# Patient Record
Sex: Female | Born: 1960 | Race: White | Hispanic: No | Marital: Married | Smoking: Former smoker
Health system: Southern US, Community
[De-identification: ages and names within clinical notes are randomized; demographics above are authoritative.]

## PROBLEM LIST (undated history)

## (undated) DIAGNOSIS — R011 Cardiac murmur, unspecified: Secondary | ICD-10-CM

## (undated) DIAGNOSIS — I10 Essential (primary) hypertension: Secondary | ICD-10-CM

## (undated) DIAGNOSIS — E785 Hyperlipidemia, unspecified: Secondary | ICD-10-CM

## (undated) DIAGNOSIS — N879 Dysplasia of cervix uteri, unspecified: Secondary | ICD-10-CM

## (undated) HISTORY — DX: Essential (primary) hypertension: I10

## (undated) HISTORY — DX: Cardiac murmur, unspecified: R01.1

## (undated) HISTORY — DX: Hyperlipidemia, unspecified: E78.5

## (undated) HISTORY — DX: Dysplasia of cervix uteri, unspecified: N87.9

---

## 2000-09-09 ENCOUNTER — Other Ambulatory Visit: Admission: RE | Admit: 2000-09-09 | Discharge: 2000-09-09 | Payer: Self-pay | Admitting: Family Medicine

## 2000-09-11 ENCOUNTER — Encounter: Payer: Self-pay | Admitting: Family Medicine

## 2000-09-11 ENCOUNTER — Ambulatory Visit (HOSPITAL_COMMUNITY): Admission: RE | Admit: 2000-09-11 | Discharge: 2000-09-11 | Payer: Self-pay | Admitting: Family Medicine

## 2001-10-15 ENCOUNTER — Other Ambulatory Visit: Admission: RE | Admit: 2001-10-15 | Discharge: 2001-10-15 | Payer: Self-pay | Admitting: Family Medicine

## 2003-11-15 ENCOUNTER — Other Ambulatory Visit: Admission: RE | Admit: 2003-11-15 | Discharge: 2003-11-15 | Payer: Self-pay | Admitting: Family Medicine

## 2004-11-15 ENCOUNTER — Other Ambulatory Visit: Admission: RE | Admit: 2004-11-15 | Discharge: 2004-11-15 | Payer: Self-pay | Admitting: Family Medicine

## 2005-12-25 ENCOUNTER — Other Ambulatory Visit: Admission: RE | Admit: 2005-12-25 | Discharge: 2005-12-25 | Payer: Self-pay | Admitting: Family Medicine

## 2007-04-16 ENCOUNTER — Other Ambulatory Visit: Admission: RE | Admit: 2007-04-16 | Discharge: 2007-04-16 | Payer: Self-pay | Admitting: Family Medicine

## 2008-04-25 ENCOUNTER — Other Ambulatory Visit: Admission: RE | Admit: 2008-04-25 | Discharge: 2008-04-25 | Payer: Self-pay | Admitting: Family Medicine

## 2009-05-01 ENCOUNTER — Encounter: Admission: RE | Admit: 2009-05-01 | Discharge: 2009-05-01 | Payer: Self-pay | Admitting: Family Medicine

## 2009-05-24 ENCOUNTER — Other Ambulatory Visit: Admission: RE | Admit: 2009-05-24 | Discharge: 2009-05-24 | Payer: Self-pay | Admitting: Family Medicine

## 2010-06-11 ENCOUNTER — Other Ambulatory Visit: Admission: RE | Admit: 2010-06-11 | Discharge: 2010-06-11 | Payer: Self-pay | Admitting: Family Medicine

## 2017-12-12 ENCOUNTER — Other Ambulatory Visit: Payer: Self-pay | Admitting: Family Medicine

## 2017-12-12 ENCOUNTER — Other Ambulatory Visit: Payer: Self-pay

## 2017-12-12 ENCOUNTER — Ambulatory Visit
Admission: RE | Admit: 2017-12-12 | Discharge: 2017-12-12 | Disposition: A | Discharge status: Home / Self Care | Payer: 59 | Source: Ambulatory Visit | Attending: Family Medicine | Admitting: Family Medicine

## 2017-12-12 DIAGNOSIS — J4531 Mild persistent asthma with (acute) exacerbation: Secondary | ICD-10-CM

## 2018-01-27 ENCOUNTER — Other Ambulatory Visit: Payer: Self-pay | Admitting: Family Medicine

## 2018-01-27 ENCOUNTER — Encounter: Payer: Self-pay | Admitting: Genetics

## 2018-01-27 DIAGNOSIS — D72828 Other elevated white blood cell count: Secondary | ICD-10-CM

## 2018-01-27 DIAGNOSIS — J40 Bronchitis, not specified as acute or chronic: Secondary | ICD-10-CM

## 2018-02-02 ENCOUNTER — Encounter: Payer: Self-pay | Admitting: Internal Medicine

## 2018-02-16 ENCOUNTER — Encounter (HOSPITAL_COMMUNITY): Payer: Self-pay

## 2018-02-16 ENCOUNTER — Encounter: Payer: Self-pay | Admitting: Internal Medicine

## 2018-02-16 ENCOUNTER — Ambulatory Visit (HOSPITAL_COMMUNITY)
Admission: RE | Admit: 2018-02-16 | Discharge: 2018-02-16 | Disposition: A | Discharge status: Home / Self Care | Payer: Managed Care, Other (non HMO) | Source: Ambulatory Visit | Attending: Internal Medicine | Admitting: Internal Medicine

## 2018-02-16 ENCOUNTER — Inpatient Hospital Stay: Payer: Managed Care, Other (non HMO)

## 2018-02-16 ENCOUNTER — Inpatient Hospital Stay: Payer: Managed Care, Other (non HMO) | Attending: Internal Medicine | Admitting: Internal Medicine

## 2018-02-16 VITALS — BP 139/90 | HR 86 | Temp 98.1°F | Resp 20 | Ht 67.5 in | Wt 194.6 lb

## 2018-02-16 DIAGNOSIS — R05 Cough: Secondary | ICD-10-CM | POA: Diagnosis not present

## 2018-02-16 DIAGNOSIS — R062 Wheezing: Secondary | ICD-10-CM

## 2018-02-16 DIAGNOSIS — F17210 Nicotine dependence, cigarettes, uncomplicated: Secondary | ICD-10-CM | POA: Diagnosis not present

## 2018-02-16 DIAGNOSIS — Z801 Family history of malignant neoplasm of trachea, bronchus and lung: Secondary | ICD-10-CM | POA: Diagnosis not present

## 2018-02-16 DIAGNOSIS — S2243XA Multiple fractures of ribs, bilateral, initial encounter for closed fracture: Secondary | ICD-10-CM | POA: Insufficient documentation

## 2018-02-16 DIAGNOSIS — R911 Solitary pulmonary nodule: Secondary | ICD-10-CM | POA: Diagnosis not present

## 2018-02-16 DIAGNOSIS — E785 Hyperlipidemia, unspecified: Secondary | ICD-10-CM | POA: Insufficient documentation

## 2018-02-16 DIAGNOSIS — R0602 Shortness of breath: Secondary | ICD-10-CM | POA: Insufficient documentation

## 2018-02-16 DIAGNOSIS — D72829 Elevated white blood cell count, unspecified: Secondary | ICD-10-CM

## 2018-02-16 DIAGNOSIS — I2510 Atherosclerotic heart disease of native coronary artery without angina pectoris: Secondary | ICD-10-CM | POA: Insufficient documentation

## 2018-02-16 DIAGNOSIS — K589 Irritable bowel syndrome without diarrhea: Secondary | ICD-10-CM | POA: Diagnosis not present

## 2018-02-16 DIAGNOSIS — J438 Other emphysema: Secondary | ICD-10-CM | POA: Diagnosis not present

## 2018-02-16 DIAGNOSIS — I10 Essential (primary) hypertension: Secondary | ICD-10-CM | POA: Insufficient documentation

## 2018-02-16 DIAGNOSIS — R059 Cough, unspecified: Secondary | ICD-10-CM | POA: Insufficient documentation

## 2018-02-16 DIAGNOSIS — J432 Centrilobular emphysema: Secondary | ICD-10-CM | POA: Insufficient documentation

## 2018-02-16 LAB — CBC WITH DIFFERENTIAL (CANCER CENTER ONLY)
Basophils Absolute: 0 10*3/uL (ref 0.0–0.1)
Basophils Relative: 0 %
Eosinophils Absolute: 0.1 10*3/uL (ref 0.0–0.5)
Eosinophils Relative: 1 %
HEMATOCRIT: 38.3 % (ref 34.8–46.6)
Hemoglobin: 13.4 g/dL (ref 11.6–15.9)
Lymphocytes Relative: 27 %
Lymphs Abs: 2.8 10*3/uL (ref 0.9–3.3)
MCH: 33 pg (ref 25.1–34.0)
MCHC: 35 g/dL (ref 31.5–36.0)
MCV: 94.3 fL (ref 79.5–101.0)
MONOS PCT: 12 %
Monocytes Absolute: 1.2 10*3/uL — ABNORMAL HIGH (ref 0.1–0.9)
NEUTROS ABS: 6 10*3/uL (ref 1.5–6.5)
Neutrophils Relative %: 60 %
Platelet Count: 339 10*3/uL (ref 145–400)
RBC: 4.06 MIL/uL (ref 3.70–5.45)
RDW: 13.1 % (ref 11.2–14.5)
WBC Count: 10.1 10*3/uL (ref 3.9–10.3)

## 2018-02-16 LAB — CMP (CANCER CENTER ONLY)
ALBUMIN: 4.1 g/dL (ref 3.5–5.0)
ALT: 76 U/L — ABNORMAL HIGH (ref 0–44)
AST: 43 U/L — AB (ref 15–41)
Alkaline Phosphatase: 63 U/L (ref 38–126)
Anion gap: 8 (ref 5–15)
BUN: 5 mg/dL — AB (ref 6–20)
CO2: 28 mmol/L (ref 22–32)
Calcium: 9.5 mg/dL (ref 8.9–10.3)
Chloride: 91 mmol/L — ABNORMAL LOW (ref 98–111)
Creatinine: 0.77 mg/dL (ref 0.44–1.00)
GFR, Est AFR Am: >60 mL/min (ref >60)
GFR, Estimated: >60 mL/min (ref >60)
Glucose, Bld: 104 mg/dL — ABNORMAL HIGH (ref 70–99)
Potassium: 3.4 mmol/L — ABNORMAL LOW (ref 3.5–5.1)
Sodium: 127 mmol/L — ABNORMAL LOW (ref 135–145)
Total Bilirubin: 0.5 mg/dL (ref 0.3–1.2)
Total Protein: 6.8 g/dL (ref 6.5–8.1)

## 2018-02-16 LAB — LACTATE DEHYDROGENASE: LDH: 226 U/L — ABNORMAL HIGH (ref 98–192)

## 2018-02-16 MED ORDER — IOHEXOL 300 MG/ML  SOLN
75.0000 mL | Freq: Once | INTRAMUSCULAR | Status: AC | PRN
  Administered 2018-02-16: 75 mL via INTRAVENOUS

## 2018-02-16 NOTE — Progress Notes (Signed)
[facility] [phone]   [phone]  CONSULT NOTE  REFERRING PHYSICIAN: Dr. Merri Brunette   REASON FOR CONSULTATION:  57 years old white female with persistent leukocytosis.  HPI Melika M Cowman is a 57 y.o. female with past medical history significant for hypertension, dyslipidemia, cervical dysplasia, irritable bowel disease.  The patient also has a long history of smoking.  She has been complaining of cough and shortness of breath since January 2019.  She was treated with several courses of antibiotics with no improvement in her condition.  She was also treated recently with steroids.  The patient continues to have significant wheezing and shortness of breath and severe cough.  She was seen by her primary care physician and was noticed on blood work to have elevated white blood count.  She was referred to me today for evaluation of her leukocytosis.  She was supposed to have CT scan of the chest but this was placed on hold until she sees me for the leukocytosis.  She also complains of indigestion and weight loss.  She also has nausea and diarrhea.  She denied having any headache or visual changes.  She has no current fever or chills. Family history significant for mother with coronary artery disease and diabetes mellitus, father had lung cancer. The patient is married and has no children.  She worked at a receiving office.  She has a history for smoking 1 pack/Schamp for around 4 years and she continues to smoke.  She denied having any history of alcohol or drug abuse.  HPI  Past Medical History:  Diagnosis Date  . Cervical dysplasia   . Dyslipidemia   . Hypertension      Family History  Problem Relation Age of Onset  . Diabetes Mother   . Coronary artery disease Mother   . Cancer Father   . Lung cancer Father     Social History Social History   Tobacco Use  . Smoking status: Current Every Decarli Smoker    Packs/Brundage: 1.00    Years: 40.00   Pack years: 40.00    Types: Cigarettes  . Smokeless tobacco: Never Used  Substance Use Topics  . Alcohol use: Not Currently  . Drug use: Not Currently    Not on File  No current outpatient medications on file.   No current facility-administered medications for this visit.     Review of Systems  Constitutional: positive for fatigue and weight loss Eyes: negative Ears, nose, mouth, throat, and face: negative Respiratory: positive for cough, dyspnea on exertion and wheezing Cardiovascular: negative Gastrointestinal: negative Genitourinary:negative Integument/breast: negative Hematologic/lymphatic: negative Musculoskeletal:negative Neurological: negative Behavioral/Psych: negative Endocrine: negative Allergic/Immunologic: negative  Physical Exam  AVW:UJWJX, healthy, no distress, well nourished, well developed and anxious SKIN: skin color, texture, turgor are normal, no rashes or significant lesions HEAD: Normocephalic, No masses, lesions, tenderness or abnormalities EYES: normal, PERRLA, Conjunctiva are pink and non-injected EARS: External ears normal, Canals clear OROPHARYNX:no exudate, no erythema and lips, buccal mucosa, and tongue normal  NECK: supple, no adenopathy, no JVD LYMPH:  no palpable lymphadenopathy, no hepatosplenomegaly BREAST:not examined LUNGS: prolonged expiratory phase, expiratory wheezes bilaterally HEART: regular rate & rhythm, no murmurs and no gallops ABDOMEN:abdomen soft, non-tender, normal bowel sounds and no masses or organomegaly BACK: Back symmetric, no curvature., No CVA tenderness EXTREMITIES:no joint deformities, effusion, or inflammation, no edema  NEURO: alert & oriented x 3 with fluent speech, no focal motor/sensory deficits  PERFORMANCE STATUS: ECOG 1  LABORATORY DATA: Lab Results  Component Value Date   WBC 10.1 02/16/2018   HGB 13.4 02/16/2018   HCT 38.3 02/16/2018   MCV 94.3 02/16/2018   PLT 339 02/16/2018      Chemistry       Component Value Date/Time   NA 127 (L) 02/16/2018 1147   K 3.4 (L) 02/16/2018 1147   CL 91 (L) 02/16/2018 1147   CO2 28 02/16/2018 1147   BUN 5 (L) 02/16/2018 1147   CREATININE 0.77 02/16/2018 1147      Component Value Date/Time   CALCIUM 9.5 02/16/2018 1147   ALKPHOS 63 02/16/2018 1147   AST 43 (H) 02/16/2018 1147   ALT 76 (H) 02/16/2018 1147   BILITOT 0.5 02/16/2018 1147       RADIOGRAPHIC STUDIES: No results found.  ASSESSMENT: This is a very pleasant 57 years old white female who presented for evaluation of persistent leukocytosis.  This was likely reactive in nature secondary to her frequent infection with bronchitis as well as treatment recently with steroids.  Repeat CBC today showed normal white blood count. The patient has a major problem with her persistent cough shortness of breath and wheezing bilaterally.  I am concerned that the patient could have underlying abnormality in her lung.   PLAN: I had a lengthy discussion with the patient today about her condition.  I assured her regarding the leukocytosis which completely resolved at this point. I recommended for the patient to have CT scan of the chest performed as soon as possible for evaluation of her shortness of breath, cough and wheezing especially with her long history for smoking and family history of lung cancer. If her scan shows no abnormality, I would recommend for the patient to get a referral from her primary care physician to a pulmonologist for addressing her breathing problem. If this scan showed any concerning abnormality for malignancy, I will arrange for the patient a follow-up appointment with me for further evaluation and recommendation regarding her condition.  The patient voices understanding of current disease status and treatment options and is in agreement with the current care plan.  All questions were answered. The patient knows to call the clinic with any problems, questions or concerns.  We can certainly see the patient much sooner if necessary.  Thank you so much for allowing me to participate in the care of Rozalia M Dolinsky. I will continue to follow up the patient with you and assist in her care.  I spent 40 minutes counseling the patient face to face. The total time spent in the appointment was 60 minutes.  Disclaimer: This note was dictated with voice recognition software. Similar sounding words can inadvertently be transcribed and may not be corrected upon review.   Lajuana MatteMohamed K Tamarah Bhullar February 16, 2018, 11:29 AM

## 2018-02-17 ENCOUNTER — Telehealth: Payer: Self-pay | Admitting: Medical Oncology

## 2018-02-17 NOTE — Telephone Encounter (Signed)
Pt notified that Arbutus PedMohamed will f/u with PCP and recommend referral to pulmonologist for f/u CT scan and emphysema.

## 2018-02-26 ENCOUNTER — Other Ambulatory Visit: Payer: Self-pay | Admitting: Family Medicine

## 2018-02-26 DIAGNOSIS — E041 Nontoxic single thyroid nodule: Secondary | ICD-10-CM

## 2018-03-04 ENCOUNTER — Encounter: Payer: Self-pay | Admitting: Pulmonary Disease

## 2018-03-04 ENCOUNTER — Ambulatory Visit (INDEPENDENT_AMBULATORY_CARE_PROVIDER_SITE_OTHER): Payer: Managed Care, Other (non HMO) | Admitting: Pulmonary Disease

## 2018-03-04 VITALS — BP 120/74 | HR 80 | Ht 67.5 in | Wt 192.0 lb

## 2018-03-04 DIAGNOSIS — J432 Centrilobular emphysema: Secondary | ICD-10-CM | POA: Diagnosis not present

## 2018-03-04 DIAGNOSIS — R911 Solitary pulmonary nodule: Secondary | ICD-10-CM | POA: Diagnosis not present

## 2018-03-04 DIAGNOSIS — Z720 Tobacco use: Secondary | ICD-10-CM

## 2018-03-04 MED ORDER — BUDESONIDE-FORMOTEROL FUMARATE 160-4.5 MCG/ACT IN AERO: 2.0000 | INHALATION_SPRAY | Freq: Two times a day (BID) | RESPIRATORY_TRACT | 0 refills | Status: DC

## 2018-03-04 NOTE — Assessment & Plan Note (Signed)
We will start her on steroid/LABA combination such as Symbicort 2 puffs twice daily Albuterol can be used for rescue on an as-needed basis  She will call us for prescription if this works

## 2018-03-04 NOTE — Assessment & Plan Note (Signed)
Clearly smoking cessation is paramount and this is most important intervention here. She is trying already with Chantix and has been able to cut down to half pack per Kon.  I encouraged her to set a quit date and okay to add a nicotine patch

## 2018-03-04 NOTE — Patient Instructions (Addendum)
Tiny left lower lung nodule will need follow-up CT chest without contrast in July 2020  Symbicort 162 puffs twice daily, rinse mouth after use. Call us for prescription if this works-  Continue to use albuterol on an as-needed basis for rescue.  Smoking cessation is the most important intervention. Congratulations on cutting down. Okay to use nicotine patch as needed

## 2018-03-04 NOTE — Assessment & Plan Note (Signed)
left lower lung nodule will need 1 y follow-up CT chest without contrast in July 2020 due to risk factor smoking

## 2018-03-04 NOTE — Progress Notes (Signed)
Subjective:    Patient ID: Darlene Patrick, female    DOB: 07/08/1961, 57 y.o.   MRN: [id number]  HPI  Chief Complaint  Patient presents with  . Pulm Consult    Referred by Dr. Carol Ada for  emphysema. Per patient, this was discovered after a recent CT scan. Also found out she has 2 broken ribs from coughing so hard. High humidity increases her SOB.     57 year old smoker presents for evaluation of emphysema. She smoked about a pack per Ammons starting as a teenager, more than 40 pack years and has recently cut down to about half pack per Noda using Chantix for the last 3 weeks with the idea of quitting eventually.  She had been able to quit a few years ago for about a year until she met her husband who also smokes. She reports dyspnea on exertion and the feeling of tiredness on routine activities throughout the [place] for the past 6 months.  She reports repeated lung infections since February 2019 for which she required antibiotics.  She reports occasional hoarseness.  She was given an albuterol MDI which she uses with minimal relief and this does not last even a couple of hours. She was noted to have persistent leukocytosis and she saw Dr. Julien Nordmann, WBC count was noted to have dropped to 10.1 and leukocytosis was felt to be reactive.  Due to her long history of smoking he ordered chest CT scan which we reviewed-this showed mild centrilobular apical emphysema,There is a band in the lingula which was consistent with postinflammatory scarring and a 4 mm nodule and the left lower lobe This also showed healing rib fractures  I have also reviewed her chest x-ray from 12/15/2017 which shows mild hyperinflation she reports occasional wheezing and feels worse in the humid weather..  [place] 2000 and is also lived in [place] for 4 years.  She denies childhood history of asthma.  Past medical history also includes hypertension for  which she is on ARB/diuretic, and micro inflammatory bowel disease for which she sees Dr. Michail Sermon and is maintained on steroids    Labs were reviewed which showed sodium 127  Family history of angina in her mother and lymphoma in her father  Significant tests/ events reviewed  Spirometry showed ratio of 68, FEV1 of 68% FVC of 78% consistent with mild to moderate airway obstruction   Past Medical History:  Diagnosis Date  . Cervical dysplasia   . Dyslipidemia   . Hypertension    No past surgical history on file.  No Known Allergies  Social History   Socioeconomic History  . Marital status: Unknown    Spouse name: Not on file  . Number of children: Not on file  . Years of education: Not on file  . Highest education level: Not on file  Occupational History  . Not on file  Social Needs  . Financial resource strain: Not on file  . Food insecurity:    Worry: Not on file    Inability: Not on file  . Transportation needs:    Medical: Not on file    Non-medical: Not on file  Tobacco Use  . Smoking status: Current Every [place]nel[place] Smoker    Packs/Yaun: 1.00    Years: 40.00    Pack years: 40.00    Types: Cigarettes  . Smokeless tobacco: Never Used  Substance and Sexual Activity  . Alcohol use:  Not Currently  . Drug use: Not Currently  . Sexual activity: Not on file  Lifestyle  . Physical activity:    Days per week: Not on file    Minutes per session: Not on file  . Stress: Not on file  Relationships  . Social connections:    Talks on phone: Not on file    Gets together: Not on file    Attends religious service: Not on file    Active member of club or organization: Not on file    Attends meetings of clubs or organizations: Not on file    Relationship status: Not on file  . Intimate partner violence:    Fear of current or ex partner: Not on file    Emotionally abused: Not on file    Physically abused: Not on file    Forced sexual activity: Not on file  Other Topics  Concern  . Not on file  Social History Narrative  . Not on file      Family History  Problem Relation Age of Onset  . Diabetes Mother   . Coronary artery disease Mother   . Cancer Father   . Lung cancer Father      Review of Systems  Constitutional: Positive for unexpected weight change. Negative for fever.  HENT: Negative for congestion, dental problem, ear pain, nosebleeds, postnasal drip, rhinorrhea, sinus pressure, sneezing, sore throat and trouble swallowing.   Eyes: Negative for redness and itching.  Respiratory: Positive for cough and shortness of breath. Negative for chest tightness and wheezing.   Cardiovascular: Negative for palpitations and leg swelling.  Gastrointestinal: Negative for nausea and vomiting.  Genitourinary: Negative for dysuria.  Musculoskeletal: Negative for joint swelling.  Skin: Negative for rash.  Allergic/Immunologic: Negative.  Negative for environmental allergies, food allergies and immunocompromised state.  Neurological: Negative for headaches.  Hematological: Does not bruise/bleed easily.  Psychiatric/Behavioral: Negative for dysphoric mood. The patient is not nervous/anxious.        Objective:   Physical Exam  Gen. Pleasant, well-nourished, in no distress, normal affect ENT - debtures, no post nasal drip Neck: No JVD, no thyromegaly, no carotid bruits Lungs: no use of accessory muscles, no dullness to percussion, decreased without rales or rhonchi  Cardiovascular: Rhythm regular, heart sounds  normal, no murmurs or gallops, no peripheral edema Abdomen: soft and non-tender, no hepatosplenomegaly, BS normal. Musculoskeletal: No deformities, no cyanosis or clubbing Neuro:  alert, non focal       Assessment & Plan:

## 2018-03-05 ENCOUNTER — Ambulatory Visit
Admission: RE | Admit: 2018-03-05 | Discharge: 2018-03-05 | Disposition: A | Discharge status: Home / Self Care | Payer: Managed Care, Other (non HMO) | Source: Ambulatory Visit | Attending: Family Medicine | Admitting: Family Medicine

## 2018-03-05 DIAGNOSIS — E041 Nontoxic single thyroid nodule: Secondary | ICD-10-CM

## 2018-03-11 ENCOUNTER — Other Ambulatory Visit: Payer: Self-pay | Admitting: Family Medicine

## 2018-03-11 DIAGNOSIS — E041 Nontoxic single thyroid nodule: Secondary | ICD-10-CM

## 2018-03-17 ENCOUNTER — Ambulatory Visit
Admission: RE | Admit: 2018-03-17 | Discharge: 2018-03-17 | Disposition: A | Discharge status: Home / Self Care | Payer: Managed Care, Other (non HMO) | Source: Ambulatory Visit | Attending: Family Medicine | Admitting: Family Medicine

## 2018-03-17 ENCOUNTER — Telehealth: Payer: Self-pay | Admitting: Pulmonary Disease

## 2018-03-17 ENCOUNTER — Other Ambulatory Visit (HOSPITAL_COMMUNITY)
Admission: RE | Admit: 2018-03-17 | Discharge: 2018-03-17 | Disposition: A | Discharge status: Home / Self Care | Payer: Managed Care, Other (non HMO) | Source: Ambulatory Visit | Attending: Student | Admitting: Student

## 2018-03-17 DIAGNOSIS — E041 Nontoxic single thyroid nodule: Secondary | ICD-10-CM | POA: Diagnosis not present

## 2018-03-17 MED ORDER — BUDESONIDE-FORMOTEROL FUMARATE 160-4.5 MCG/ACT IN AERO: 2.0000 | INHALATION_SPRAY | Freq: Two times a day (BID) | RESPIRATORY_TRACT | 1 refills | Status: DC

## 2018-03-17 NOTE — Telephone Encounter (Signed)
Spoke with pt. She is requesting a prescription for Symbicort. Rx has been sent in. Nothing further was needed.

## 2018-03-18 ENCOUNTER — Telehealth: Payer: Self-pay | Admitting: Pulmonary Disease

## 2018-03-18 MED ORDER — BUDESONIDE-FORMOTEROL FUMARATE 160-4.5 MCG/ACT IN AERO: 2.0000 | INHALATION_SPRAY | Freq: Two times a day (BID) | RESPIRATORY_TRACT | 1 refills | Status: DC

## 2018-03-18 NOTE — Telephone Encounter (Signed)
Symbicort refill sent to Patient's preferred pharmacy, [facility] in [place].  Detailed message left on Patient's VM.  Nothing further at this time.

## 2018-07-02 ENCOUNTER — Other Ambulatory Visit (HOSPITAL_COMMUNITY)
Admission: RE | Admit: 2018-07-02 | Discharge: 2018-07-02 | Disposition: A | Discharge status: Home / Self Care | Payer: Managed Care, Other (non HMO) | Source: Ambulatory Visit | Attending: Family Medicine | Admitting: Family Medicine

## 2018-07-02 ENCOUNTER — Other Ambulatory Visit: Payer: Self-pay | Admitting: Family Medicine

## 2018-07-02 DIAGNOSIS — Z124 Encounter for screening for malignant neoplasm of cervix: Secondary | ICD-10-CM | POA: Insufficient documentation

## 2018-07-06 LAB — CYTOLOGY - PAP
Diagnosis: NEGATIVE
HPV: NOT DETECTED

## 2018-07-24 ENCOUNTER — Other Ambulatory Visit (HOSPITAL_COMMUNITY): Payer: Self-pay | Admitting: Internal Medicine

## 2018-07-24 ENCOUNTER — Other Ambulatory Visit: Payer: Self-pay | Admitting: Internal Medicine

## 2018-07-24 DIAGNOSIS — R7989 Other specified abnormal findings of blood chemistry: Secondary | ICD-10-CM

## 2018-07-24 DIAGNOSIS — Z8349 Family history of other endocrine, nutritional and metabolic diseases: Secondary | ICD-10-CM

## 2018-07-24 DIAGNOSIS — R9389 Abnormal findings on diagnostic imaging of other specified body structures: Secondary | ICD-10-CM

## 2018-07-24 DIAGNOSIS — E042 Nontoxic multinodular goiter: Secondary | ICD-10-CM

## 2018-08-10 ENCOUNTER — Encounter (HOSPITAL_COMMUNITY)
Admission: RE | Admit: 2018-08-10 | Discharge: 2018-08-10 | Disposition: A | Discharge status: Home / Self Care | Payer: Managed Care, Other (non HMO) | Source: Ambulatory Visit | Attending: Internal Medicine | Admitting: Internal Medicine

## 2018-08-10 ENCOUNTER — Other Ambulatory Visit (HOSPITAL_COMMUNITY): Payer: Self-pay | Admitting: Internal Medicine

## 2018-08-10 DIAGNOSIS — Z8349 Family history of other endocrine, nutritional and metabolic diseases: Secondary | ICD-10-CM

## 2018-08-10 DIAGNOSIS — E042 Nontoxic multinodular goiter: Secondary | ICD-10-CM | POA: Diagnosis present

## 2018-08-10 DIAGNOSIS — R9389 Abnormal findings on diagnostic imaging of other specified body structures: Secondary | ICD-10-CM | POA: Insufficient documentation

## 2018-08-10 DIAGNOSIS — R7989 Other specified abnormal findings of blood chemistry: Secondary | ICD-10-CM | POA: Insufficient documentation

## 2018-08-10 MED ORDER — SODIUM IODIDE I 131 CAPSULE
11.3000 | Freq: Once | INTRAVENOUS | Status: AC | PRN
  Administered 2018-08-10: 11.3 via ORAL

## 2018-08-11 ENCOUNTER — Encounter (HOSPITAL_COMMUNITY)
Admission: RE | Admit: 2018-08-11 | Discharge: 2018-08-11 | Disposition: A | Discharge status: Home / Self Care | Payer: Managed Care, Other (non HMO) | Source: Ambulatory Visit | Attending: Internal Medicine | Admitting: Internal Medicine

## 2018-08-11 MED ORDER — SODIUM PERTECHNETATE TC 99M INJECTION
10.0000 | Freq: Once | INTRAVENOUS | Status: AC | PRN
  Administered 2018-08-11: 10 via INTRAVENOUS

## 2018-09-03 ENCOUNTER — Other Ambulatory Visit: Payer: Self-pay | Admitting: Pulmonary Disease

## 2018-10-15 NOTE — Progress Notes (Signed)
Subjective:   Darlene Patrick, female    DOB: Oct 06, 1960, 58 y.o.   MRN: [id number]  Chief Complaint  Patient presents with  . Chest Pain  . New Patient (Initial Visit)     Patient referred by Darlene Patrick for chest pain.  HPI: Darlene Patrick is a 58 year old Caucasian female with hypertension, hyperlipidemia not on therapy, former tobacco use, emphysema, and micro inflammatory bowel disease. She was noted to have 2 vessel coronary atherosclerosis on CT scan in July 2019.  Patient reports that she has occasional episodes of left sided chest discomfort that can last for awhile and improves with taking several aspirin 15 minutes later. Symptoms started approximately 6 months ago. Chest pain is mostly at rest; however, has happened on occasion with walking. Has dyspnea on exertion that is unchanged and related to emphysema. She also notices that she has fatigue daily. Even with doing minimal activities. Has claudication symptoms in bilateral thighs.   Reports quitting smoking approximately 6 months ago with use of Chantix. She had approximate 30 pack year history.    Past Medical History:  Diagnosis Date  . Cervical dysplasia   . Dyslipidemia   . Heart murmur   . Hypertension     History reviewed. No pertinent surgical history.  Family History  Problem Relation Age of Onset  . Diabetes Mother   . Angina Mother   . Cancer Father   . Lung cancer Father   . Cancer Half-Brother     Social History   Socioeconomic History  . Marital status: Married    Spouse name: Not on file  . Number of children: 0  . Years of education: Not on file  . Highest education level: Not on file  Occupational History  . Not on file  Social Needs  . Financial resource strain: Not on file  . Food insecurity:    Worry: Not on file    Inability: Not on file  . Transportation needs:    Medical: Not on file    Non-medical: Not on file  Tobacco Use  . Smoking status: Former Smoker   Packs/Deckman: 1.00    Years: 40.00    Pack years: 40.00    Types: Cigarettes  . Smokeless tobacco: Never Used  Substance and Sexual Activity  . Alcohol use: Yes    Comment: occ  . Drug use: Not Currently  . Sexual activity: Not on file  Lifestyle  . Physical activity:    Days per week: Not on file    Minutes per session: Not on file  . Stress: Not on file  Relationships  . Social connections:    Talks on phone: Not on file    Gets together: Not on file    Attends religious service: Not on file    Active member of club or organization: Not on file    Attends meetings of clubs or organizations: Not on file    Relationship status: Not on file  . Intimate partner violence:    Fear of current or ex partner: Not on file    Emotionally abused: Not on file    Physically abused: Not on file    Forced sexual activity: Not on file  Other Topics Concern  . Not on file  Social History Narrative  . Not on file    Current Outpatient Medications on File Prior to Visit  Medication Sig Dispense Refill  . albuterol (PROVENTIL HFA;VENTOLIN HFA) 108 (90  Base) MCG/ACT inhaler Inhale 2 puffs into the lungs every 6 (six) hours as needed for wheezing or shortness of breath.    Marland Kitchen amLODipine (NORVASC) 5 MG tablet Take 5 mg by mouth daily.  3  . budesonide (ENTOCORT EC) 3 MG 24 hr capsule Take 3 mg by mouth as needed.     . hydrochlorothiazide (HYDRODIURIL) 25 MG tablet Take 1 tablet by mouth daily.    . lansoprazole (PREVACID) 15 MG capsule Take 15 mg by mouth daily at 12 noon.    Marland Kitchen losartan-hydrochlorothiazide (HYZAAR) 100-25 MG tablet Take 1 tablet by mouth daily.  1  . SYMBICORT 160-4.5 MCG/ACT inhaler TAKE 2 PUFFS BY MOUTH TWICE A Stephani 1 Inhaler 0  . traMADol-acetaminophen (ULTRACET) 37.5-325 MG tablet Take 1 tablet by mouth as needed.    . meloxicam (MOBIC) 15 MG tablet Take 15 mg by mouth daily.  0   No current facility-administered medications on file prior to visit.      Review of Systems   Constitution: Positive for malaise/fatigue. Negative for decreased appetite, weight gain and weight loss.  Eyes: Negative for visual disturbance.  Cardiovascular: Positive for chest pain, claudication and dyspnea on exertion. Negative for leg swelling, orthopnea, palpitations and syncope.  Respiratory: Negative for hemoptysis and wheezing.   Endocrine: Negative for cold intolerance and heat intolerance.  Hematologic/Lymphatic: Does not bruise/bleed easily.  Skin: Negative for nail changes.  Musculoskeletal: Negative for muscle weakness and myalgias.  Gastrointestinal: Negative for abdominal pain, change in bowel habit, nausea and vomiting.  Neurological: Negative for difficulty with concentration, dizziness, focal weakness and headaches.  Psychiatric/Behavioral: Negative for altered mental status and suicidal ideas.  All other systems reviewed and are negative.      Objective:     Blood pressure 125/77, pulse 73, height _0  (1.676 m), weight 193 lb 6.4 oz (87.7 kg), SpO2 96 %.  Physical Exam  Constitutional: She is oriented to person, place, and time. Vital signs are normal. She appears well-developed and well-nourished. No distress.  HENT:  Head: Normocephalic and atraumatic.  Eyes: Conjunctivae are normal.  Neck: Normal range of motion. Neck supple. No JVD present. No thyromegaly present.  Cardiovascular: Normal rate, regular rhythm, normal heart sounds and intact distal pulses. Exam reveals no gallop.  No murmur heard. Pulses:      Femoral pulses are 0 on the right side and 0 on the left side.      Popliteal pulses are 2+ on the right side and 2+ on the left side.       Dorsalis pedis pulses are 2+ on the right side and 2+ on the left side.       Posterior tibial pulses are 2+ on the right side and 2+ on the left side.  Difficult to feel pulses due to bodily habitus  Pulmonary/Chest: Effort normal and breath sounds normal. No accessory muscle usage. No respiratory distress.   Abdominal: Soft. Bowel sounds are normal. She exhibits no distension. There is no abdominal tenderness.  Musculoskeletal: Normal range of motion.        General: No edema.  Neurological: She is alert and oriented to person, place, and time.  Skin: Skin is warm and dry.  Psychiatric: She has a normal mood and affect.  Vitals reviewed.     Cardiac studies:   CT Chest W Contrast 02/16/2018: 1. Mild centrilobular and paraseptal emphysema with diffuse bronchial wall thickening, suggesting COPD. 2. Solitary 4 mm solid left lower lobe pulmonary nodule. No  follow-up needed if patient is low-risk. Non-contrast chest CT can be considered in 12 months if patient is high-risk.  3. Two vessel coronary atherosclerosis. 4. Healing anterolateral left third rib fracture. Incompletely healed posterior right twelfth rib fracture. Correlate with injury mechanism.   Recent Labs:  10/05/2018 Glucose 91. BUN/Cr 13/0.73. eGFR 82/99. Na/K 136/3.7. AST/ALT 20/38.  Rest of CMP normal.   07/02/2018: RBC 4.04. H/H 13/38. MCV 96. Platelets 294. Rest of CBC normal.  Chol 240. TG 170. HDL 76. calc LDL 128.  07/02/2018: TSH normal.   07/24/2018: Thyroglobulin, Ab <1.0 normal. TPO Ab <6 normal.  Thyroid Stim Immunoglobulin 0.20 normal.   01/21/2018: Ferritin 67.6 normal. Free T4 0.97 normal. T3, total 128 normal     Assessment & Recommendations:  1. Angina pectoris ([place]) Her symptoms are consistent with angina. She is on appropriate medical therapy. I have offered nitroglycerin; however, she does not wish to use this. Will schedule for lexisan nuclear stress test and also echocardiogram for further evaluation.   2. Coronary artery calcification seen on CT scan Will start daily 15m ASA. As stated above, will obtain lexiscan nuclear stress test.  3. Claudication in peripheral vascular disease ([place] related to small vessel disease as she has normal vascular exam. Secondary to long history of  tobacco use. Will continue to monitor.  4. Mixed hyperlipidemia I have discussed elevated lipids and also indications for statin in view of coronary atherosclerosis on CT scan. Benefits and risk were discussed in detail; however, patient continues to be reluctant to taking a statin. She wants to further discuss with her PCP prior to starting this. Will further discuss at your next office visit.   5. Former smoker Congratulated her on quitting smoking. Stressed importance of continued smoking cessation.  Plan: I will see her back after the test for further recommendations and reevaluation.  Thank you for referring this patient. Please do not hesitate to contact me for any questions.    *I have discussed this case with Dr. GEinar Gipand he personally examined the patient and participated in formulating the plan.*Jeri Lager MSN, APRN, FNP-C PKaiser [facility] FranciscoCardiovascular, PWashitaOffice: [phone]: [phone]

## 2018-10-16 ENCOUNTER — Ambulatory Visit (INDEPENDENT_AMBULATORY_CARE_PROVIDER_SITE_OTHER): Payer: Managed Care, Other (non HMO) | Admitting: Cardiology

## 2018-10-16 ENCOUNTER — Encounter: Payer: Self-pay | Admitting: Cardiology

## 2018-10-16 ENCOUNTER — Other Ambulatory Visit: Payer: Self-pay

## 2018-10-16 VITALS — BP 125/77 | HR 73 | Ht 66.0 in | Wt 193.4 lb

## 2018-10-16 DIAGNOSIS — I739 Peripheral vascular disease, unspecified: Secondary | ICD-10-CM

## 2018-10-16 DIAGNOSIS — I25110 Atherosclerotic heart disease of native coronary artery with unstable angina pectoris: Secondary | ICD-10-CM | POA: Diagnosis not present

## 2018-10-16 DIAGNOSIS — E782 Mixed hyperlipidemia: Secondary | ICD-10-CM | POA: Diagnosis not present

## 2018-10-16 DIAGNOSIS — I2510 Atherosclerotic heart disease of native coronary artery without angina pectoris: Secondary | ICD-10-CM | POA: Insufficient documentation

## 2018-10-16 DIAGNOSIS — Z87891 Personal history of nicotine dependence: Secondary | ICD-10-CM

## 2018-10-16 DIAGNOSIS — I209 Angina pectoris, unspecified: Secondary | ICD-10-CM

## 2018-10-16 MED ORDER — ASPIRIN EC 81 MG PO TBEC: 81.0000 mg | DELAYED_RELEASE_TABLET | Freq: Every day | ORAL | 1 refills | Status: AC

## 2018-10-21 ENCOUNTER — Encounter: Payer: Self-pay | Admitting: Cardiology

## 2018-11-05 ENCOUNTER — Other Ambulatory Visit: Payer: Managed Care, Other (non HMO)

## 2018-11-20 ENCOUNTER — Other Ambulatory Visit: Payer: Managed Care, Other (non HMO)

## 2018-12-03 ENCOUNTER — Ambulatory Visit: Payer: Managed Care, Other (non HMO) | Admitting: Cardiology

## 2019-02-13 ENCOUNTER — Other Ambulatory Visit: Payer: Self-pay | Admitting: Family Medicine

## 2019-02-13 DIAGNOSIS — F17200 Nicotine dependence, unspecified, uncomplicated: Secondary | ICD-10-CM

## 2019-02-13 DIAGNOSIS — R911 Solitary pulmonary nodule: Secondary | ICD-10-CM

## 2019-05-11 ENCOUNTER — Other Ambulatory Visit: Payer: Self-pay

## 2019-05-11 ENCOUNTER — Ambulatory Visit
Admission: RE | Admit: 2019-05-11 | Discharge: 2019-05-11 | Disposition: A | Discharge status: Home / Self Care | Payer: Managed Care, Other (non HMO) | Source: Ambulatory Visit | Attending: Family Medicine | Admitting: Family Medicine

## 2019-05-11 DIAGNOSIS — R911 Solitary pulmonary nodule: Secondary | ICD-10-CM

## 2019-05-11 DIAGNOSIS — F17200 Nicotine dependence, unspecified, uncomplicated: Secondary | ICD-10-CM

## 2019-08-04 ENCOUNTER — Telehealth: Payer: Self-pay

## 2022-02-11 ENCOUNTER — Other Ambulatory Visit: Payer: Self-pay | Admitting: Internal Medicine

## 2022-02-11 DIAGNOSIS — E0520 Thyrotoxicosis with toxic multinodular goiter without thyrotoxic crisis or storm: Secondary | ICD-10-CM

## 2022-02-11 DIAGNOSIS — E0590 Thyrotoxicosis, unspecified without thyrotoxic crisis or storm: Secondary | ICD-10-CM

## 2022-02-15 ENCOUNTER — Other Ambulatory Visit: Payer: Managed Care, Other (non HMO)

## 2022-02-19 ENCOUNTER — Other Ambulatory Visit (HOSPITAL_COMMUNITY): Payer: Self-pay | Admitting: Internal Medicine

## 2022-02-19 DIAGNOSIS — E0520 Thyrotoxicosis with toxic multinodular goiter without thyrotoxic crisis or storm: Secondary | ICD-10-CM

## 2022-02-19 DIAGNOSIS — E0590 Thyrotoxicosis, unspecified without thyrotoxic crisis or storm: Secondary | ICD-10-CM

## 2022-02-28 ENCOUNTER — Encounter (HOSPITAL_COMMUNITY)
Admission: RE | Admit: 2022-02-28 | Discharge: 2022-02-28 | Disposition: A | Discharge status: Home / Self Care | Payer: 59 | Source: Ambulatory Visit | Attending: Internal Medicine | Admitting: Internal Medicine

## 2022-02-28 DIAGNOSIS — E0520 Thyrotoxicosis with toxic multinodular goiter without thyrotoxic crisis or storm: Secondary | ICD-10-CM | POA: Diagnosis present

## 2022-02-28 DIAGNOSIS — E0590 Thyrotoxicosis, unspecified without thyrotoxic crisis or storm: Secondary | ICD-10-CM | POA: Insufficient documentation

## 2022-03-01 ENCOUNTER — Encounter (HOSPITAL_COMMUNITY)
Admission: RE | Admit: 2022-03-01 | Discharge: 2022-03-01 | Disposition: A | Discharge status: Home / Self Care | Payer: 59 | Source: Ambulatory Visit | Attending: Internal Medicine | Admitting: Internal Medicine
